# Patient Record
Sex: Male | Born: 1955 | Race: White | Hispanic: No | Marital: Single | Smoking: Former smoker
Health system: Southern US, Community
[De-identification: ages and names within clinical notes are randomized; demographics above are authoritative.]

## PROBLEM LIST (undated history)

## (undated) DIAGNOSIS — K219 Gastro-esophageal reflux disease without esophagitis: Secondary | ICD-10-CM

## (undated) DIAGNOSIS — I10 Essential (primary) hypertension: Secondary | ICD-10-CM

## (undated) DIAGNOSIS — R5383 Other fatigue: Secondary | ICD-10-CM

## (undated) DIAGNOSIS — I5032 Chronic diastolic (congestive) heart failure: Secondary | ICD-10-CM

## (undated) HISTORY — DX: Other fatigue: R53.83

## (undated) HISTORY — DX: Gastro-esophageal reflux disease without esophagitis: K21.9

## (undated) HISTORY — PX: KNEE SURGERY: SHX244

## (undated) HISTORY — DX: Morbid (severe) obesity due to excess calories: E66.01

## (undated) HISTORY — DX: Essential (primary) hypertension: I10

## (undated) HISTORY — DX: Chronic diastolic (congestive) heart failure: I50.32

## (undated) SURGERY — LEFT HEART CATH AND CORONARY ANGIOGRAPHY
Anesthesia: Moderate Sedation

---

## 2003-07-23 ENCOUNTER — Encounter: Admission: RE | Admit: 2003-07-23 | Discharge: 2003-07-23 | Payer: Self-pay | Admitting: Family Medicine

## 2006-04-15 ENCOUNTER — Encounter: Admission: RE | Admit: 2006-04-15 | Discharge: 2006-04-15 | Payer: Self-pay | Admitting: Family Medicine

## 2006-12-23 ENCOUNTER — Encounter: Admission: RE | Admit: 2006-12-23 | Discharge: 2006-12-23 | Payer: Self-pay | Admitting: Family Medicine

## 2007-06-29 IMAGING — CT CT PELVIS W/ CM
2 of 6 series · 16 of 46 positions shown, 18 images · IV contrast (READICAT/WATER & 125 ML OMNI 300)
Comparison: none

CLINICAL DATA: Hematuria.  Frequent urination.   Multiple prostate infections. 
 ABDOMEN CT WITHOUT AND WITH CONTRAST:
TECHNIQUE: Multidetector CT imaging of the abdomen was performed both before and during bolus administration of intravenous contrast.
 Contrast:  125 cc Omnipaque 300
TECHNIQUE: Multidetector CT imaging of the pelvis was performed following the standard protocol during bolus administration of intravenous contrast.

[Series 4: routine abdomen · axial · 0.94mm/px · z∈[-436,-1]mm · 13 of 103 slices shown, 15 images]
[im 8/103  soft-tissue]
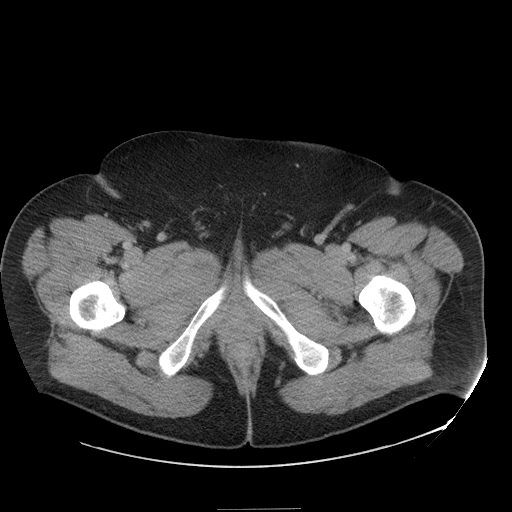
[im 8/103  bone]
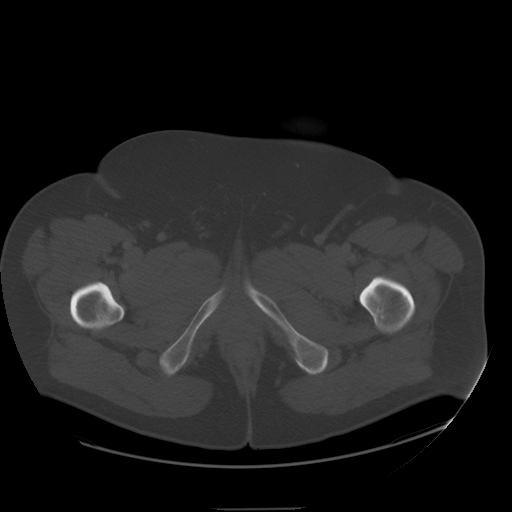
[im 15/103  soft-tissue]
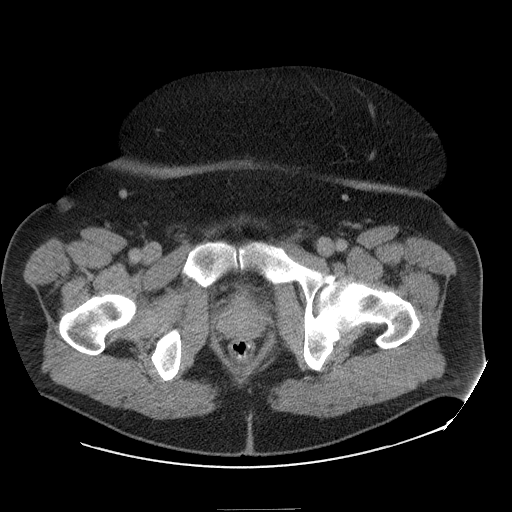
[im 22/103  soft-tissue]
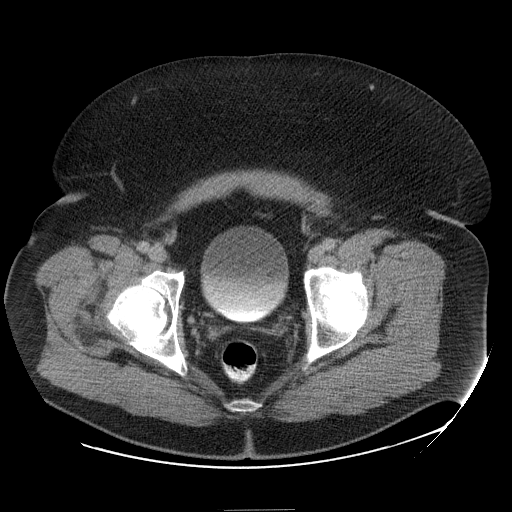
[im 30/103  soft-tissue]
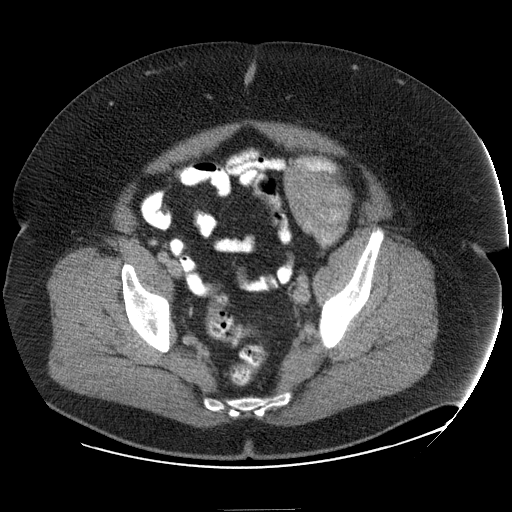
[im 37/103  soft-tissue]
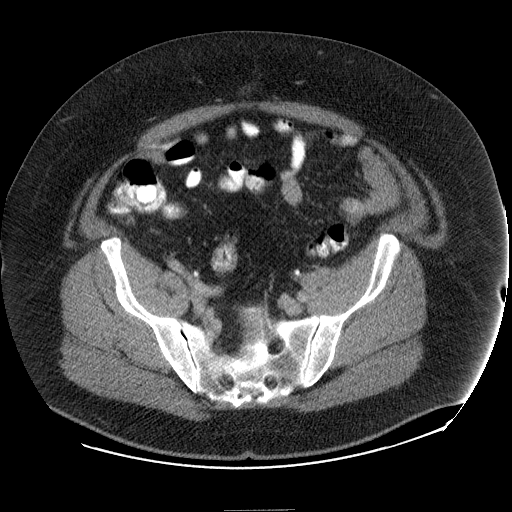
[im 44/103  soft-tissue]
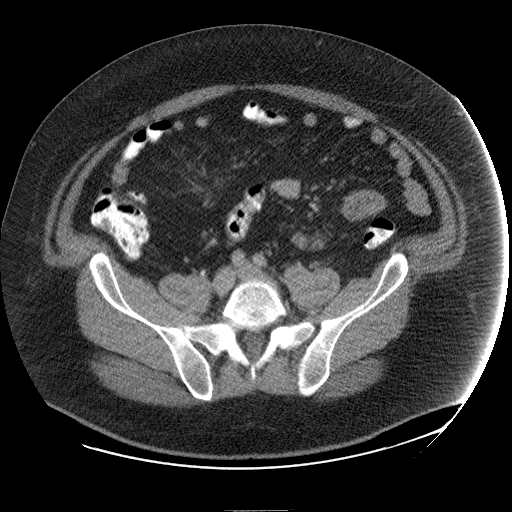
[im 52/103  soft-tissue]
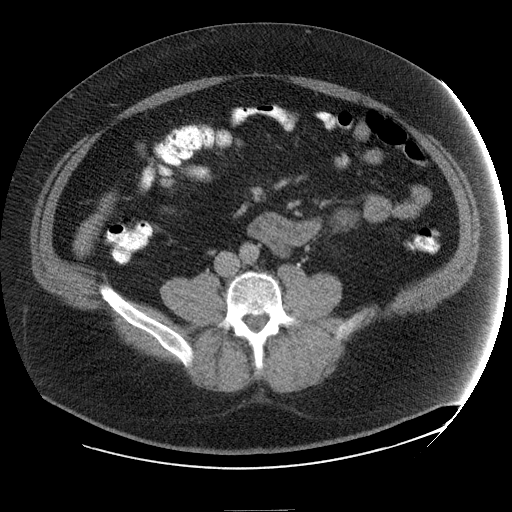
[im 59/103  soft-tissue]
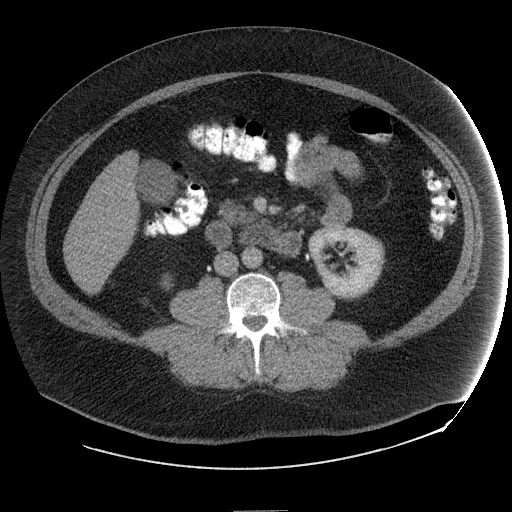
[im 66/103  soft-tissue]
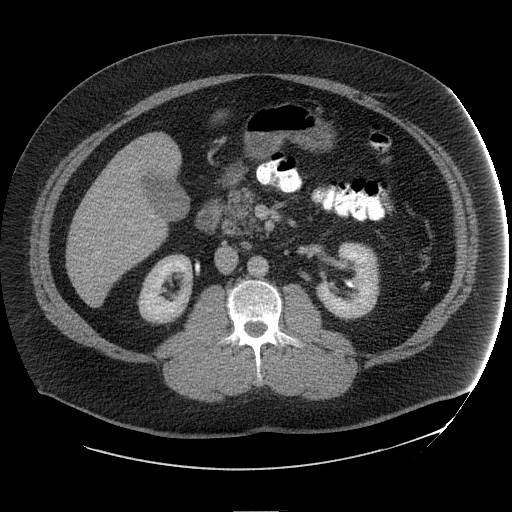
[im 66/103  bone]
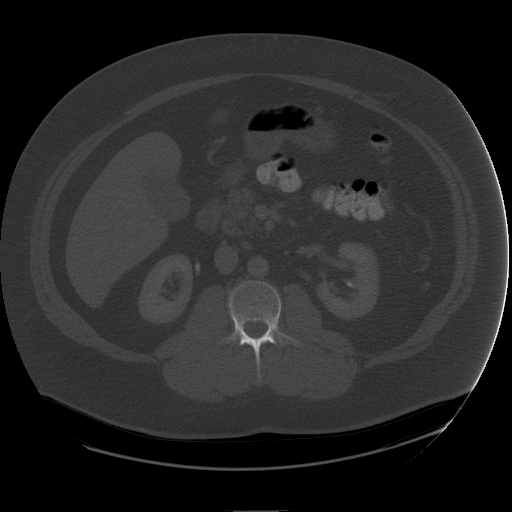
[im 73/103  soft-tissue]
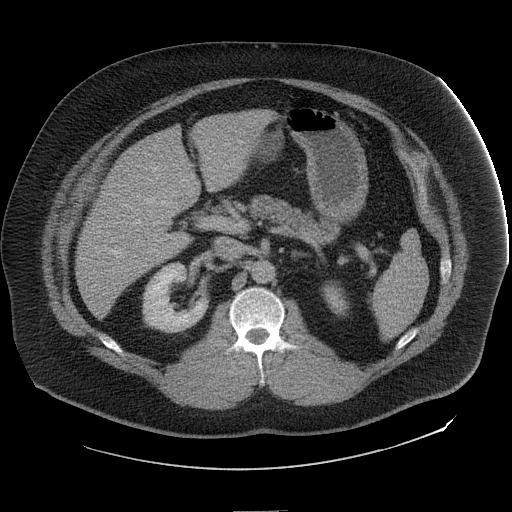
[im 81/103  soft-tissue]
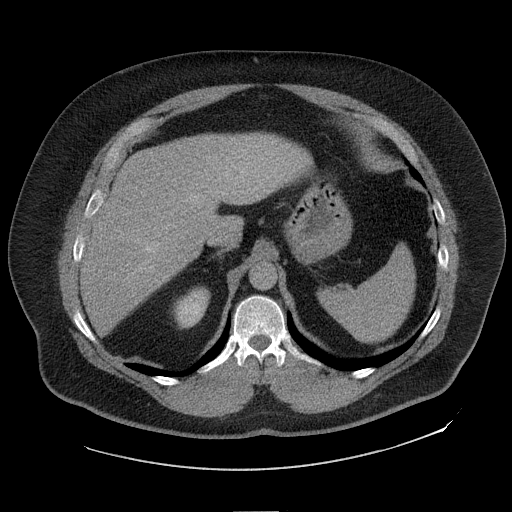
[im 88/103  soft-tissue]
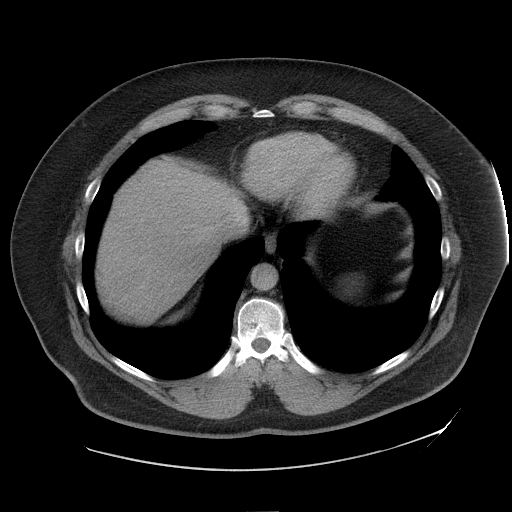
[im 95/103  soft-tissue]
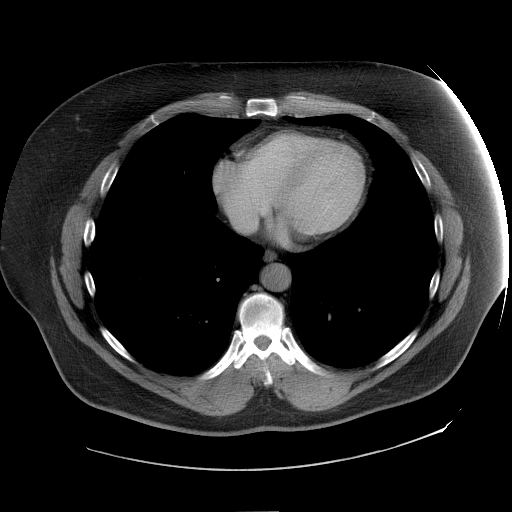

[Series 602: sagittal body · sagittal · 1.02mm/px · 3 of 192 slices shown]
[im 64/192  soft-tissue]
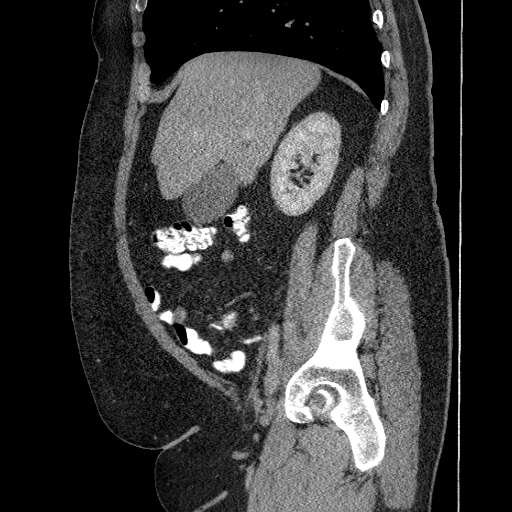
[im 85/192  soft-tissue]
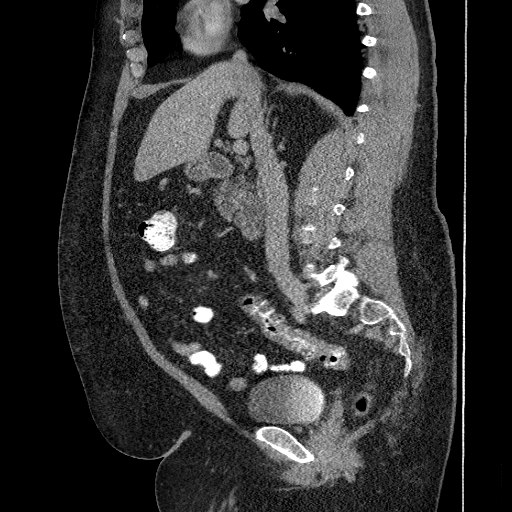
[im 107/192  soft-tissue]
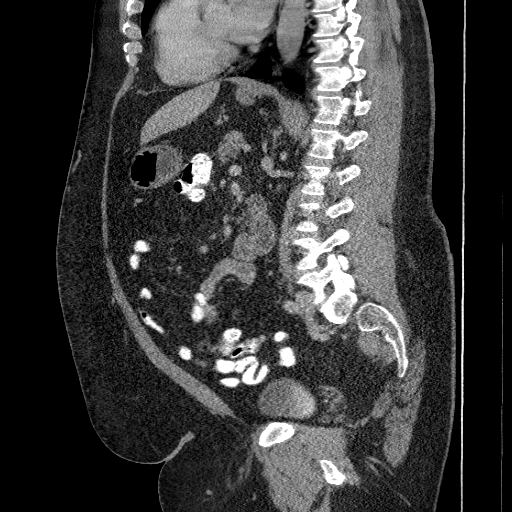

[16 of 46 positions shown; findings below may reference images not displayed]

FINDINGS: There is a 5.5 mm nodule in the right middle lobe on image #1.  CT of the chest may be helpful to assess for other nodules.  On the unenhanced study, no renal calculi are seen.  No calcified gallstones are noted. 
 After contrast administration, the kidneys enhance.  No renal mass is seen.  The pelvocaliceal systems appear normal.  The remainder of the study shows the liver to appear normal.  No ductal dilatation is seen.  The pancreas is normal in size with normal peripancreatic fat planes.  The adrenal glands and spleen appear normal.  The abdominal aorta is normal in caliber and no adenopathy is noted.
IMPRESSION: Negative CT of the abdomen.  No renal mass, calculi or hydronephrosis.  
 PELVIS CT WITH CONTRAST:
FINDINGS: The urinary bladder is moderately well-opacified with no intraluminal lesion evident.  The distal ureters are normal in caliber.  The prostate is normal in size.  The terminal ileum and appendix are well seen and appear normal.  No fluid is seen within the pelvis.  Degenerative disk disease is noted at L5-S1.
IMPRESSION: 1.  No bladder lesion is seen.  The distal ureters are normal in caliber. 
 2.  Appendix and terminal ileum appear normal. 
 3.  Degenerative disk disease at L5-S1.

## 2015-06-10 ENCOUNTER — Ambulatory Visit
Admission: RE | Admit: 2015-06-10 | Discharge: 2015-06-10 | Disposition: A | Discharge status: Home / Self Care | Payer: Self-pay | Source: Ambulatory Visit | Attending: Physician Assistant | Admitting: Physician Assistant

## 2015-06-10 ENCOUNTER — Other Ambulatory Visit: Payer: Self-pay | Admitting: Physician Assistant

## 2015-06-10 DIAGNOSIS — R0602 Shortness of breath: Secondary | ICD-10-CM

## 2015-06-11 ENCOUNTER — Telehealth: Payer: Self-pay | Admitting: Cardiovascular Disease

## 2015-06-11 NOTE — Telephone Encounter (Signed)
Received records from [facility] for appointment on 10/20/2053 with Dr Duke Salviaandolph.  [facility] for Dr Leonides Sakeandolph's schedule on 07/04/15. lp

## 2015-07-04 ENCOUNTER — Encounter: Payer: Self-pay | Admitting: Cardiovascular Disease

## 2015-07-04 ENCOUNTER — Ambulatory Visit (INDEPENDENT_AMBULATORY_CARE_PROVIDER_SITE_OTHER): Payer: BLUE CROSS/BLUE SHIELD | Admitting: Cardiovascular Disease

## 2015-07-04 VITALS — BP 112/74 | HR 76 | Ht 73.0 in | Wt 396.6 lb

## 2015-07-04 DIAGNOSIS — F329 Major depressive disorder, single episode, unspecified: Secondary | ICD-10-CM

## 2015-07-04 DIAGNOSIS — I10 Essential (primary) hypertension: Secondary | ICD-10-CM

## 2015-07-04 DIAGNOSIS — R079 Chest pain, unspecified: Secondary | ICD-10-CM | POA: Diagnosis not present

## 2015-07-04 DIAGNOSIS — R0602 Shortness of breath: Secondary | ICD-10-CM

## 2015-07-04 DIAGNOSIS — R5383 Other fatigue: Secondary | ICD-10-CM

## 2015-07-04 HISTORY — DX: Other fatigue: R53.83

## 2015-07-04 HISTORY — DX: Morbid (severe) obesity due to excess calories: E66.01

## 2015-07-04 HISTORY — DX: Essential (primary) hypertension: I10

## 2015-07-04 LAB — CBC WITH DIFFERENTIAL/PLATELET
BASOS ABS: 0 {cells}/uL (ref 0–200)
Basophils Relative: 0 %
EOS ABS: 174 {cells}/uL (ref 15–500)
Eosinophils Relative: 2 %
HEMATOCRIT: 45.3 % (ref 38.5–50.0)
Hemoglobin: 14.8 g/dL (ref 13.2–17.1)
LYMPHS PCT: 21 %
Lymphs Abs: 1827 cells/uL (ref 850–3900)
MCH: 29.6 pg (ref 27.0–33.0)
MCHC: 32.7 g/dL (ref 32.0–36.0)
MCV: 90.6 fL (ref 80.0–100.0)
MONO ABS: 957 {cells}/uL — AB (ref 200–950)
MPV: 11.4 fL (ref 7.5–12.5)
Monocytes Relative: 11 %
NEUTROS PCT: 66 %
Neutro Abs: 5742 cells/uL (ref 1500–7800)
Platelets: 178 10*3/uL (ref 140–400)
RBC: 5 MIL/uL (ref 4.20–5.80)
RDW: 14.2 % (ref 11.0–15.0)
WBC: 8.7 10*3/uL (ref 3.8–10.8)

## 2015-07-04 LAB — APTT: APTT: 31 s (ref 24–37)

## 2015-07-04 LAB — PROTIME-INR
INR: 1.03 (ref <1.50)
Prothrombin Time: 13.6 seconds (ref 11.6–15.2)

## 2015-07-04 NOTE — Patient Instructions (Addendum)
Medication Instructions:  Your physician recommends that you continue on your current medications as directed. Please refer to the Current Medication list given to you today.  Labwork: PT/INR/PTT/CBC/BMET ON THE FIRST FLOOR AT [facility]   Testing/Procedures: Your physician has requested that you have an echocardiogram. Echocardiography is a painless test that uses sound waves to create images of your heart. It provides your doctor with information about the size and shape of your heart and how well your heart's chambers and valves are working. This procedure takes approximately one hour. There are no restrictions for this procedure. [facility] AT [address]  Your physician has requested that you have a cardiac catheterization. Cardiac catheterization is used to diagnose and/or treat various heart conditions. Doctors may recommend this procedure for a number of different reasons. The most common reason is to evaluate chest pain. Chest pain can be a symptom of coronary artery disease (CAD), and cardiac catheterization can show whether plaque is narrowing or blocking your heart's arteries. This procedure is also used to evaluate the valves, as well as measure the blood flow and oxygen levels in different parts of your heart. For further information please visit [email or web address]. Please follow instruction sheet, as given.  Follow-Up: Your physician recommends that you schedule a follow-up appointment in: 1 MONTH OV  If you need a refill on your cardiac medications before your next appointment, please call your pharmacy.

## 2015-07-04 NOTE — Progress Notes (Signed)
Cardiology Office Note   Date:  07/04/2015   ID:  William Jensen, DOB [birth year], MRN [id number]  PCP:  William Jensen,MICHELLE, MD  Cardiologist:   William Siiffany Agawam, MD   Chief Complaint  Patient presents with  . New Evaluation    Referred by William Redmon, William Jensen for SOB  pt c/o SOB on minimal exertion; occasional chest discomfort--acid reflux; leg cramping--takes potassium      History of Present Illness: William Jensen is a 60 y.o. male with hypertension, GERD, and a hiatal hernia who presents for an evaluation of shortness of breath. William Jensen saw his PCP, William MakiNoelle Reddmon, PA, on 06/10/15.  At that appointment he reported progressive shortness of breath. He was referred to cardiology for evaluation.  William Jensen reports that he has been feeling very short of breath for over a year now.  He feels like he has no energy. By the time he walks into the garage at home he has to stop and sit down. He struggles all day at work and has a hard time climbing into his truck. This is been progressively worse over the last year. Some days it is better than others.  He denies chest pain or pressure. He also has not noted any lower extremity edema or orthopnea. He does have swelling in his knees which is chronic. He notes that he occasionally wakes himself up because he is not breathing well at night. He has been told that it seems like he stops breathing overnight. He also snores heavily. He has never had a sleep study.  He reports frequent nocturia, up to 4 times per night. He typically goes to bed at 10 PM and gets up at 4 AM.   William Jensen does not get any formal exercise. 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 It is becoming increasingly difficult for him to do so.  He has acid reflux that is alleviated with belching.  He denies exertional chest pain.  He gets chest pain after eating hot peppers.  It also occurs when sitting in the recliner.   Past  Medical History  Diagnosis Date  . Hypertension   . Acid reflux   . Morbid obesity (HCC) 07/04/2015  . Essential hypertension 07/04/2015  . Fatigue 07/04/2015    Past Surgical History  Procedure Laterality Date  . Knee surgery      right and left     Current Outpatient Prescriptions  Medication Sig Dispense Refill  . aspirin 81 MG tablet Take 81 mg by mouth daily. Sometimes takes it daily for weeks, then stops    . ibuprofen (ADVIL) 200 MG tablet Take 600 mg by mouth every 6 (six) hours as needed (for pain).    Marland Kitchen. lisinopril (PRINIVIL,ZESTRIL) 20 MG tablet Take 20 mg by mouth daily.    . Misc Natural Products (OSTEO BI-FLEX TRIPLE STRENGTH PO) Take 1-2 tablets by mouth daily.    Marland Kitchen. omeprazole (PRILOSEC) 20 MG capsule Take 20 mg by mouth daily.    . Potassium Gluconate 550 MG TABS Take 4-5 tablets by mouth daily.    . TRIAMCINOLONE ACETONIDE, TOP, 0.05 % OINT Apply 1 application topically 2 (two) times daily.     No current facility-administered medications for this visit.    Allergies:   Nickel and Zinc    Social History:  The patient  reports that he quit smoking about 7 years ago. He has never used smokeless tobacco.  He reports that he does not drink alcohol or use illicit drugs.   Family History:  The patient's family history includes Heart disease in his mother; Prostate cancer in his brother.    ROS:  Please see the history of present illness.   Otherwise, review of systems are positive for none.   All other systems are reviewed and negative.    PHYSICAL EXAM: VS:  BP 112/74 mmHg  Pulse 76  Ht 6\' 1"  (1.854 m)  Wt 179.897 kg (396 lb 9.6 oz)  BMI 52.34 kg/m2 , BMI Body mass index is 52.34 kg/(m^2). GENERAL:  Well appearing.  Morbidly obese HEENT:  Pupils equal round and reactive, fundi not visualized, oral mucosa unremarkable NECK:  Unable to assess neck 2/2 large beard LYMPHATICS:  No cervical adenopathy LUNGS:  Clear to auscultation bilaterally HEART:  RRR.  PMI not  displaced or sustained,S1 and S2 within normal limits, no S3, no S4, no clicks, no rubs, no murmurs ABD:  Flat, positive bowel sounds normal in frequency in pitch, no bruits, no rebound, no guarding, no midline pulsatile mass, no hepatomegaly, no splenomegaly EXT:  2 plus pulses throughout, no edema, no cyanosis no clubbing SKIN:  No rashes no nodules NEURO:  Cranial nerves II through XII grossly intact, motor grossly intact throughout PSYCH:  Cognitively intact, oriented to person place and time   EKG:  EKG is ordered today. The ekg ordered today demonstrates sinus rhythm rate 76 bpm. Left axis deviation. 4//17: Sinus rhythm. Rate 67 bpm. Left anterior fascicular block. (Independent review)  Chest x-ray 06/10/15: No active cardiopulmonary disease  Recent Labs: No results found for requested labs within last 365 days.   06/10/15: Hemoglobin A1c 6.4% WBC 6.8, hemoglobin 14.5, hematocrit 44.9, platelets 161 BNP 20 TSH 0.65 03/22/14: Sodium 138, potassium 4.8, BUN 20, creatinine 1.2 AST 15, ALT 20 Total cholesterol 185, triglycerides 240, HDL 38, LDL 99  Lipid Panel No results found for: CHOL, TRIG, HDL, CHOLHDL, VLDL, LDLCALC, LDLDIRECT    Wt Readings from Last 3 Encounters:  07/04/15 179.897 kg (396 lb 9.6 oz)      ASSESSMENT AND PLAN:  # Chest pain: # Shortness of breath:  William Jensen's symptoms are concerning for heart failure. However, he had a BNP that was within normal limits. This can be falsely low due to obesity. We will obtain an echocardiogram to evaluate for structural heart disease. Ideally, he would undergo a stress test for his atypical chest pain. However, he is too large to undergo nuclear stress testing and is unable to walk on a treadmill. Therefore, we will refer him for cardiac catheterization to rule out ischemia.  # Hypertension: Blood pressure is well-controlled. Continue lisinopril.  # CV Disease Prevention:  We discussed the importance of regular exercise,  diet, and losing weight. He expressed understanding but is not inclined to do much different right now.  He is also limited by knee pain.   # Fatigue: I'm concerned that obstructive sleep apnea may be the cause of William Jensen's fatigue. His Epworth sleepiness scale was 5. However, here endorses daytime fatigue, awakening himself gasping for air, and heavy snoring. I recommended that he undergo a sleep study but he declined. If the above stress tests are normal, he will consider it.  All labs thus far have been within normal limits.   Current medicines are reviewed at length with the patient today.  The patient does not have concerns regarding medicines.  The following changes have been made:  no change  Labs/ tests ordered today include:   Orders Placed This Encounter  Procedures  . PTT  . INR/PT  . CBC with Differential/Platelet  . Basic metabolic panel  . EKG 12-Lead  . ECHOCARDIOGRAM COMPLETE     Disposition:   FU with Tu Shimmel C. Kingston, MD, FACC in 1 month   This note was written with the assistance of speech recognition software.  Please excuse any transcriptional errors.  Signed, Ramia Sidney C. Crewe, MD, FACC  07/04/2015 1:23 PM    [facility]  

## 2015-07-05 ENCOUNTER — Other Ambulatory Visit: Payer: Self-pay | Admitting: Cardiovascular Disease

## 2015-07-05 LAB — BASIC METABOLIC PANEL
BUN: 16 mg/dL (ref 7–25)
CHLORIDE: 102 mmol/L (ref 98–110)
CO2: 25 mmol/L (ref 20–31)
CREATININE: 1.07 mg/dL (ref 0.70–1.25)
Calcium: 9.2 mg/dL (ref 8.6–10.3)
Glucose, Bld: 97 mg/dL (ref 65–99)
Potassium: 4.8 mmol/L (ref 3.5–5.3)
Sodium: 138 mmol/L (ref 135–146)

## 2015-07-08 ENCOUNTER — Telehealth: Payer: Self-pay | Admitting: Cardiovascular Disease

## 2015-07-08 NOTE — Telephone Encounter (Signed)
Fu  Pt returning RN phone call- lab results. Please call back and discuss.   

## 2015-07-08 NOTE — Telephone Encounter (Signed)
Spoke to patient. Lab Result given. Can still proceed with cath . Verbalized understanding

## 2015-07-09 ENCOUNTER — Encounter: Payer: Self-pay | Admitting: Physician Assistant

## 2015-07-10 ENCOUNTER — Telehealth: Payer: Self-pay | Admitting: Cardiovascular Disease

## 2015-07-10 DIAGNOSIS — R0609 Other forms of dyspnea: Secondary | ICD-10-CM

## 2015-07-10 DIAGNOSIS — I209 Angina pectoris, unspecified: Secondary | ICD-10-CM | POA: Diagnosis present

## 2015-07-10 NOTE — Telephone Encounter (Signed)
error 

## 2015-07-11 ENCOUNTER — Encounter (HOSPITAL_COMMUNITY): Payer: Self-pay | Admitting: Cardiology

## 2015-07-11 ENCOUNTER — Encounter (HOSPITAL_COMMUNITY)
Admission: RE | Disposition: A | Discharge status: Home / Self Care | Payer: Self-pay | Source: Ambulatory Visit | Attending: Cardiology

## 2015-07-11 ENCOUNTER — Ambulatory Visit (HOSPITAL_COMMUNITY)
Admission: RE | Admit: 2015-07-11 | Discharge: 2015-07-11 | Disposition: A | Discharge status: Home / Self Care | Payer: BLUE CROSS/BLUE SHIELD | Source: Ambulatory Visit | Attending: Cardiology | Admitting: Cardiology

## 2015-07-11 DIAGNOSIS — Z87891 Personal history of nicotine dependence: Secondary | ICD-10-CM | POA: Insufficient documentation

## 2015-07-11 DIAGNOSIS — I10 Essential (primary) hypertension: Secondary | ICD-10-CM | POA: Diagnosis not present

## 2015-07-11 DIAGNOSIS — I209 Angina pectoris, unspecified: Secondary | ICD-10-CM | POA: Diagnosis not present

## 2015-07-11 DIAGNOSIS — I25119 Atherosclerotic heart disease of native coronary artery with unspecified angina pectoris: Secondary | ICD-10-CM | POA: Insufficient documentation

## 2015-07-11 DIAGNOSIS — K219 Gastro-esophageal reflux disease without esophagitis: Secondary | ICD-10-CM | POA: Insufficient documentation

## 2015-07-11 DIAGNOSIS — R0609 Other forms of dyspnea: Secondary | ICD-10-CM | POA: Diagnosis not present

## 2015-07-11 DIAGNOSIS — Z7982 Long term (current) use of aspirin: Secondary | ICD-10-CM | POA: Insufficient documentation

## 2015-07-11 HISTORY — PX: CARDIAC CATHETERIZATION: SHX172

## 2015-07-11 SURGERY — LEFT HEART CATH AND CORONARY ANGIOGRAPHY
Anesthesia: LOCAL

## 2015-07-11 MED ORDER — HEPARIN (PORCINE) IN NACL 2-0.9 UNIT/ML-% IJ SOLN
INTRAMUSCULAR | Status: DC | PRN
  Administered 2015-07-11: 1500 mL

## 2015-07-11 MED ORDER — FUROSEMIDE 20 MG PO TABS: 20.0000 mg | ORAL_TABLET | Freq: Every day | ORAL | Status: DC

## 2015-07-11 MED ORDER — VERAPAMIL HCL 2.5 MG/ML IV SOLN
INTRAVENOUS | Status: AC
  Filled 2015-07-11: qty 2

## 2015-07-11 MED ORDER — MORPHINE SULFATE (PF) 2 MG/ML IV SOLN: 1.0000 mg | INTRAVENOUS | Status: DC | PRN

## 2015-07-11 MED ORDER — SODIUM CHLORIDE 0.9% FLUSH: 3.0000 mL | INTRAVENOUS | Status: DC | PRN

## 2015-07-11 MED ORDER — LIDOCAINE HCL (PF) 1 % IJ SOLN
INTRAMUSCULAR | Status: DC | PRN
  Administered 2015-07-11: 2 mL via INTRADERMAL

## 2015-07-11 MED ORDER — MIDAZOLAM HCL 2 MG/2ML IJ SOLN
INTRAMUSCULAR | Status: AC
  Filled 2015-07-11: qty 2

## 2015-07-11 MED ORDER — IOPAMIDOL (ISOVUE-370) INJECTION 76%
INTRAVENOUS | Status: AC
  Filled 2015-07-11: qty 100

## 2015-07-11 MED ORDER — FENTANYL CITRATE (PF) 100 MCG/2ML IJ SOLN
INTRAMUSCULAR | Status: AC
  Filled 2015-07-11: qty 2

## 2015-07-11 MED ORDER — LIDOCAINE HCL (PF) 1 % IJ SOLN
INTRAMUSCULAR | Status: AC
  Filled 2015-07-11: qty 30

## 2015-07-11 MED ORDER — SODIUM CHLORIDE 0.9 % WEIGHT BASED INFUSION
3.0000 mL/kg/h | INTRAVENOUS | Status: DC
  Administered 2015-07-11: 3 mL/kg/h via INTRAVENOUS

## 2015-07-11 MED ORDER — FUROSEMIDE 10 MG/ML IJ SOLN
20.0000 mg | Freq: Once | INTRAMUSCULAR | Status: AC
  Administered 2015-07-11: 20 mg via INTRAVENOUS

## 2015-07-11 MED ORDER — ONDANSETRON HCL 4 MG/2ML IJ SOLN: 4.0000 mg | Freq: Four times a day (QID) | INTRAMUSCULAR | Status: DC | PRN

## 2015-07-11 MED ORDER — ASPIRIN 81 MG PO CHEW
CHEWABLE_TABLET | ORAL | Status: AC
  Filled 2015-07-11: qty 1

## 2015-07-11 MED ORDER — FENTANYL CITRATE (PF) 100 MCG/2ML IJ SOLN
INTRAMUSCULAR | Status: DC | PRN
  Administered 2015-07-11: 25 ug via INTRAVENOUS

## 2015-07-11 MED ORDER — VERAPAMIL HCL 2.5 MG/ML IV SOLN
INTRAVENOUS | Status: DC | PRN
  Administered 2015-07-11: 10 mL via INTRA_ARTERIAL

## 2015-07-11 MED ORDER — HEPARIN (PORCINE) IN NACL 2-0.9 UNIT/ML-% IJ SOLN
INTRAMUSCULAR | Status: AC
  Filled 2015-07-11: qty 1000

## 2015-07-11 MED ORDER — SODIUM CHLORIDE 0.9 % IV SOLN: INTRAVENOUS | Status: DC

## 2015-07-11 MED ORDER — SODIUM CHLORIDE 0.9 % IV SOLN: 250.0000 mL | INTRAVENOUS | Status: DC | PRN

## 2015-07-11 MED ORDER — SODIUM CHLORIDE 0.9% FLUSH: 3.0000 mL | Freq: Two times a day (BID) | INTRAVENOUS | Status: DC

## 2015-07-11 MED ORDER — ACETAMINOPHEN 325 MG PO TABS: 650.0000 mg | ORAL_TABLET | ORAL | Status: DC | PRN

## 2015-07-11 MED ORDER — IOPAMIDOL (ISOVUE-370) INJECTION 76%
INTRAVENOUS | Status: DC | PRN
Start: 2015-07-11 — End: 2015-07-11
  Administered 2015-07-11: 110 mL via INTRA_ARTERIAL

## 2015-07-11 MED ORDER — FUROSEMIDE 10 MG/ML IJ SOLN
INTRAMUSCULAR | Status: AC
  Administered 2015-07-11: 20 mg via INTRAVENOUS
  Filled 2015-07-11: qty 4

## 2015-07-11 MED ORDER — HEPARIN SODIUM (PORCINE) 1000 UNIT/ML IJ SOLN
INTRAMUSCULAR | Status: AC
  Filled 2015-07-11: qty 1

## 2015-07-11 MED ORDER — HEPARIN (PORCINE) IN NACL 2-0.9 UNIT/ML-% IJ SOLN
INTRAMUSCULAR | Status: AC
  Filled 2015-07-11: qty 500

## 2015-07-11 MED ORDER — ASPIRIN 81 MG PO CHEW
81.0000 mg | CHEWABLE_TABLET | ORAL | Status: AC
  Administered 2015-07-11: 81 mg via ORAL

## 2015-07-11 MED ORDER — MIDAZOLAM HCL 2 MG/2ML IJ SOLN
INTRAMUSCULAR | Status: DC | PRN
  Administered 2015-07-11: 1 mg via INTRAVENOUS

## 2015-07-11 MED ORDER — SODIUM CHLORIDE 0.9 % WEIGHT BASED INFUSION: 1.0000 mL/kg/h | INTRAVENOUS | Status: DC

## 2015-07-11 MED ORDER — FUROSEMIDE 20 MG PO TABS: 20.0000 mg | ORAL_TABLET | Freq: Every day | ORAL | Status: AC

## 2015-07-11 MED ORDER — HEPARIN SODIUM (PORCINE) 1000 UNIT/ML IJ SOLN
INTRAMUSCULAR | Status: DC | PRN
  Administered 2015-07-11: 6000 [IU] via INTRAVENOUS

## 2015-07-11 SURGICAL SUPPLY — 12 items
CATH INFINITI 5FR ANG PIGTAIL (CATHETERS) ×1 IMPLANT
CATH INFINITI JR4 5F (CATHETERS) ×1 IMPLANT
CATH OPTITORQUE TIG 4.0 5F (CATHETERS) ×1 IMPLANT
DEVICE RAD COMP TR BAND LRG (VASCULAR PRODUCTS) ×1 IMPLANT
GLIDESHEATH SLEND A-KIT 6F 22G (SHEATH) ×1 IMPLANT
HOVERMATT SINGLE USE (MISCELLANEOUS) ×1 IMPLANT
KIT HEART LEFT (KITS) ×2 IMPLANT
PACK CARDIAC CATHETERIZATION (CUSTOM PROCEDURE TRAY) ×2 IMPLANT
SYR MEDRAD MARK V 150ML (SYRINGE) ×2 IMPLANT
TRANSDUCER W/STOPCOCK (MISCELLANEOUS) ×2 IMPLANT
TUBING CIL FLEX 10 FLL-RA (TUBING) ×2 IMPLANT
WIRE SAFE-T 1.5MM-J .035X260CM (WIRE) ×1 IMPLANT

## 2015-07-11 NOTE — Interval H&P Note (Signed)
History and Physical Interval Note:  07/11/2015 9:16 AM  William Jensen  has presented today for surgery, with the diagnosis of exertional dyspnea as anginal equivalent. Considered progressive angina    The various methods of treatment have been discussed with the patient and family. After consideration of risks, benefits and other options for treatment, the patient has consented to  Procedure(s): Left Heart Cath and Coronary Angiography (N/A) With Possible Percutaneous Coronary Intervention as a surgical intervention .  The patient's history has been reviewed, patient examined, no change in status, stable for surgery.  I have reviewed the patient's chart and labs.  Questions were answered to the patient's satisfaction.    Cath Lab Visit (complete for each Cath Lab visit)  Clinical Evaluation Leading to the Procedure:   ACS: No.  Non-ACS:    Anginal Classification: CCS III  Anti-ischemic medical therapy: Minimal Therapy (1 class of medications)  Non-Invasive Test Results: No non-invasive testing performed  Prior CABG: No previous CABG   HARDING, DAVID W  AUC for Cath CAD Assessment (Coronary Angiography With or Without Left Heart Catheterization and/or Left Ventriculography)  Patient Information:    Suspected CAD (No Prior PCI, No Prior CABG, and No Prior Angiogram Showing >= 50% Angiographic Stenosis)   No Prior Noninvasive Testing   Symptomatic   Intermediate Pretest Probability  AUC Score:   U (6)   Indication:   9  The decision was made to proceed with catheterization due to the patient's body habitus. Nuclear stress test likely not be accurate.  AUC for PCI will be pending results of diagnostic catheterization.  Marykay LexHARDING, DAVID W, M.D., M.S. Interventional Cardiologist   Pager # [phone] Phone # [phone] [address] [place], [place] [address]

## 2015-07-11 NOTE — H&P (View-Only) (Signed)
Cardiology Office Note   Date:  07/04/2015   ID:  William Jensen, DOB [birth year], MRN [id number]  PCP:  William HillierEDMAN,MICHELLE, MD  Cardiologist:   William Siiffany Agawam, MD   Chief Complaint  Patient presents with  . New Evaluation    Referred by William Redmon, PA-C for SOB  pt c/o SOB on minimal exertion; occasional chest discomfort--acid reflux; leg cramping--takes potassium      History of Present Illness: William Jensen is a 60 y.o. male with hypertension, GERD, and a hiatal hernia who presents for an evaluation of shortness of breath. William Jensen saw his PCP, William MakiNoelle Reddmon, PA, on 06/10/15.  At that appointment he reported progressive shortness of breath. He was referred to cardiology for evaluation.  William Jensen reports that he has been feeling very short of breath for over a year now.  He feels like he has no energy. By the time he walks into the garage at home he has to stop and sit down. He struggles all day at work and has a hard time climbing into his truck. This is been progressively worse over the last year. Some days it is better than others.  He denies chest pain or pressure. He also has not noted any lower extremity edema or orthopnea. He does have swelling in his knees which is chronic. He notes that he occasionally wakes himself up because he is not breathing well at night. He has been told that it seems like he stops breathing overnight. He also snores heavily. He has never had a sleep study.  He reports frequent nocturia, up to 4 times per night. He typically goes to bed at 10 PM and gets up at 4 AM.   William Jensen does not get any formal exercise. 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 It is becoming increasingly difficult for him to do so.  He has acid reflux that is alleviated with belching.  He denies exertional chest pain.  He gets chest pain after eating hot peppers.  It also occurs when sitting in the recliner.   Past  Medical History  Diagnosis Date  . Hypertension   . Acid reflux   . Morbid obesity (HCC) 07/04/2015  . Essential hypertension 07/04/2015  . Fatigue 07/04/2015    Past Surgical History  Procedure Laterality Date  . Knee surgery      right and left     Current Outpatient Prescriptions  Medication Sig Dispense Refill  . aspirin 81 MG tablet Take 81 mg by mouth daily. Sometimes takes it daily for weeks, then stops    . ibuprofen (ADVIL) 200 MG tablet Take 600 mg by mouth every 6 (six) hours as needed (for pain).    Marland Kitchen. lisinopril (PRINIVIL,ZESTRIL) 20 MG tablet Take 20 mg by mouth daily.    . Misc Natural Products (OSTEO BI-FLEX TRIPLE STRENGTH PO) Take 1-2 tablets by mouth daily.    Marland Kitchen. omeprazole (PRILOSEC) 20 MG capsule Take 20 mg by mouth daily.    . Potassium Gluconate 550 MG TABS Take 4-5 tablets by mouth daily.    . TRIAMCINOLONE ACETONIDE, TOP, 0.05 % OINT Apply 1 application topically 2 (two) times daily.     No current facility-administered medications for this visit.    Allergies:   Nickel and Zinc    Social History:  The patient  reports that he quit smoking about 7 years ago. He has never used smokeless tobacco.  He reports that he does not drink alcohol or use illicit drugs.   Family History:  The patient's family history includes Heart disease in his mother; Prostate cancer in his brother.    ROS:  Please see the history of present illness.   Otherwise, review of systems are positive for none.   All other systems are reviewed and negative.    PHYSICAL EXAM: VS:  BP 112/74 mmHg  Pulse 76  Ht 6\' 1"  (1.854 m)  Wt 179.897 kg (396 lb 9.6 oz)  BMI 52.34 kg/m2 , BMI Body mass index is 52.34 kg/(m^2). GENERAL:  Well appearing.  Morbidly obese HEENT:  Pupils equal round and reactive, fundi not visualized, oral mucosa unremarkable NECK:  Unable to assess neck 2/2 large beard LYMPHATICS:  No cervical adenopathy LUNGS:  Clear to auscultation bilaterally HEART:  RRR.  PMI not  displaced or sustained,S1 and S2 within normal limits, no S3, no S4, no clicks, no rubs, no murmurs ABD:  Flat, positive bowel sounds normal in frequency in pitch, no bruits, no rebound, no guarding, no midline pulsatile mass, no hepatomegaly, no splenomegaly EXT:  2 plus pulses throughout, no edema, no cyanosis no clubbing SKIN:  No rashes no nodules NEURO:  Cranial nerves II through XII grossly intact, motor grossly intact throughout PSYCH:  Cognitively intact, oriented to person place and time   EKG:  EKG is ordered today. The ekg ordered today demonstrates sinus rhythm rate 76 bpm. Left axis deviation. 4//17: Sinus rhythm. Rate 67 bpm. Left anterior fascicular block. (Independent review)  Chest x-ray 06/10/15: No active cardiopulmonary disease  Recent Labs: No results found for requested labs within last 365 days.   06/10/15: Hemoglobin A1c 6.4% WBC 6.8, hemoglobin 14.5, hematocrit 44.9, platelets 161 BNP 20 TSH 0.65 03/22/14: Sodium 138, potassium 4.8, BUN 20, creatinine 1.2 AST 15, ALT 20 Total cholesterol 185, triglycerides 240, HDL 38, LDL 99  Lipid Panel No results found for: CHOL, TRIG, HDL, CHOLHDL, VLDL, LDLCALC, LDLDIRECT    Wt Readings from Last 3 Encounters:  07/04/15 179.897 kg (396 lb 9.6 oz)      ASSESSMENT AND PLAN:  # Chest pain: # Shortness of breath:  [name]'s symptoms are concerning for heart failure. However, he had a BNP that was within normal limits. This can be falsely low due to obesity. We will obtain an echocardiogram to evaluate for structural heart disease. Ideally, he would undergo a stress test for his atypical chest pain. However, he is too large to undergo nuclear stress testing and is unable to walk on a treadmill. Therefore, we will refer him for cardiac catheterization to rule out ischemia.  # Hypertension: Blood pressure is well-controlled. Continue lisinopril.  # CV Disease Prevention:  We discussed the importance of regular exercise,  diet, and losing weight. He expressed understanding but is not inclined to do much different right now.  He is also limited by knee pain.   # Fatigue: I'm concerned that obstructive sleep apnea may be the cause of [name]'s fatigue. His Epworth sleepiness scale was 5. However, here endorses daytime fatigue, awakening himself gasping for air, and heavy snoring. I recommended that he undergo a sleep study but he declined. If the above stress tests are normal, he will consider it.  All labs thus far have been within normal limits.   Current medicines are reviewed at length with the patient today.  The patient does not have concerns regarding medicines.  The following changes have been made:  no change  Labs/ tests ordered today include:   Orders Placed This Encounter  Procedures  . PTT  . INR/PT  . CBC with Differential/Platelet  . Basic metabolic panel  . EKG 12-Lead  . ECHOCARDIOGRAM COMPLETE     Disposition:   FU with William Mink C. Duke Salvia, MD, [facility] in 1 month   This note was written with the assistance of speech recognition software.  Please excuse any transcriptional errors.  Signed, Phiona Ramnauth C. Duke Salvia, MD, [facility]  07/04/2015 1:23 PM    [facility]

## 2015-07-11 NOTE — Discharge Instructions (Signed)
Radial Site Care °Refer to this sheet in the next few weeks. These instructions provide you with information about caring for yourself after your procedure. Your health care provider may also give you more specific instructions. Your treatment has been planned according to current medical practices, but problems sometimes occur. Call your health care provider if you have any problems or questions after your procedure. °WHAT TO EXPECT AFTER THE PROCEDURE °After your procedure, it is typical to have the following: °· Bruising at the radial site that usually fades within 1-2 weeks. °· Blood collecting in the tissue (hematoma) that may be painful to the touch. It should usually decrease in size and tenderness within 1-2 weeks. °HOME CARE INSTRUCTIONS °· Take medicines only as directed by your health care provider. °· You may shower 24-48 hours after the procedure or as directed by your health care provider. Remove the bandage (dressing) and gently wash the site with plain soap and water. Pat the area dry with a clean towel. Do not rub the site, because this may cause bleeding. °· Do not take baths, swim, or use a hot tub until your health care provider approves. °· Check your insertion site every day for redness, swelling, or drainage. °· Do not apply powder or lotion to the site. °· Do not flex or bend the affected arm for 24 hours or as directed by your health care provider. °· Do not push or pull heavy objects with the affected arm for 24 hours or as directed by your health care provider. °· Do not lift over 10 lb (4.5 kg) for 5 days after your procedure or as directed by your health care provider. °· Ask your health care provider when it is okay to: °¨ Return to work or school. °¨ Resume usual physical activities or sports. °¨ Resume sexual activity. °· Do not drive home if you are discharged the same day as the procedure. Have someone else drive you. °· You may drive 24 hours after the procedure unless otherwise  instructed by your health care provider. °· Do not operate machinery or power tools for 24 hours after the procedure. °· If your procedure was done as an outpatient procedure, which means that you went home the same day as your procedure, a responsible adult should be with you for the first 24 hours after you arrive home. °· Keep all follow-up visits as directed by your health care provider. This is important. °SEEK MEDICAL CARE IF: °· You have a fever. °· You have chills. °· You have increased bleeding from the radial site. Hold pressure on the site and call 911. °SEEK IMMEDIATE MEDICAL CARE IF: °· You have unusual pain at the radial site. °· You have redness, warmth, or swelling at the radial site. °· You have drainage (other than a small amount of blood on the dressing) from the radial site. °· The radial site is bleeding, and the bleeding does not stop after 30 minutes of holding steady pressure on the site. °· Your arm or hand becomes pale, cool, tingly, or numb. °  °This information is not intended to replace advice given to you by your health care provider. Make sure you discuss any questions you have with your health care provider. °  °Document Released: 03/27/2010 Document Revised: 03/15/2014 Document Reviewed: 09/10/2013 °Elsevier Interactive Patient Education ©2016 [employer] ° °

## 2015-07-16 ENCOUNTER — Other Ambulatory Visit (HOSPITAL_COMMUNITY): Payer: BLUE CROSS/BLUE SHIELD

## 2015-07-22 ENCOUNTER — Other Ambulatory Visit: Payer: Self-pay

## 2015-07-22 ENCOUNTER — Other Ambulatory Visit (HOSPITAL_COMMUNITY): Payer: Self-pay | Admitting: *Deleted

## 2015-07-22 ENCOUNTER — Ambulatory Visit (HOSPITAL_COMMUNITY): Payer: BLUE CROSS/BLUE SHIELD | Attending: Cardiology

## 2015-07-22 DIAGNOSIS — R0602 Shortness of breath: Secondary | ICD-10-CM

## 2015-07-22 DIAGNOSIS — I77810 Thoracic aortic ectasia: Secondary | ICD-10-CM | POA: Insufficient documentation

## 2015-07-22 DIAGNOSIS — R079 Chest pain, unspecified: Secondary | ICD-10-CM

## 2015-07-22 DIAGNOSIS — Z6841 Body Mass Index (BMI) 40.0 and over, adult: Secondary | ICD-10-CM | POA: Insufficient documentation

## 2015-07-22 DIAGNOSIS — I119 Hypertensive heart disease without heart failure: Secondary | ICD-10-CM | POA: Insufficient documentation

## 2015-07-22 MED ORDER — PERFLUTREN LIPID MICROSPHERE
1.0000 mL | INTRAVENOUS | Status: AC | PRN
  Administered 2015-07-22: 3 mL via INTRAVENOUS

## 2015-07-25 ENCOUNTER — Telehealth: Payer: Self-pay | Admitting: Cardiovascular Disease

## 2015-07-25 DIAGNOSIS — I10 Essential (primary) hypertension: Secondary | ICD-10-CM

## 2015-07-25 DIAGNOSIS — I209 Angina pectoris, unspecified: Secondary | ICD-10-CM

## 2015-07-25 DIAGNOSIS — R0609 Other forms of dyspnea: Principal | ICD-10-CM

## 2015-07-25 NOTE — Telephone Encounter (Signed)
-----   Message from Chilton Siiffany Upham, MD sent at 07/23/2015  8:43 PM EDT ----- Echo shows that his heart does not relax completely.  Aorta is mildly dilated.  We will need to repeat the echo in 1 year.  Continue lasix as started by Dr. Herbie BaltimoreHarding.

## 2015-07-25 NOTE — Telephone Encounter (Signed)
Follow Up ° °Pt returned call//  °

## 2015-07-25 NOTE — Telephone Encounter (Signed)
Left message to call back  

## 2015-07-29 NOTE — Telephone Encounter (Signed)
Returned call to pt. Gave him the reults, He verbalized understanding and is aware of f/u appt. Order for echo in 1 year entered.

## 2015-07-29 NOTE — Telephone Encounter (Signed)
F/u  Pt returning Call- pt is off work the rest of today and is able to receive phone calls. Please call back and discuss.

## 2015-08-18 NOTE — Progress Notes (Signed)
Cardiology Office Note   Date:  08/19/2015   ID:  William Jensen, DOB [birth year], MRN [id number]  PCP:  REDMON,NOELLE, PA-C  Cardiologist:   Chilton Siiffany [place], MD   Chief Complaint  Patient presents with  . Follow-up    post ECHO  pt c/o SOB on minimal exertion      History of Present Illness: William Jensen is a 60 y.o. male with hypertension, GERD, and a hiatal hernia who presents for follow up.  William Jensen was first evaluated for shortness of breath on 07/04/15.  His symptoms were concerning for ischemia. However he was not eligible for stress testing due to body habitus. Therefore, he underwent cardiac catheterization on 07/11/15. He was found to have a 10% LAD lesion. His LVEDP was 34 mmHg.  Echocardiography on 07/22/15 revealed LVEF 55-60% and grade 2 diastolic dysfunction. There is mild dilation of the ascending aorta.   Since his last appointment it was recommended that William Jensen start taking lasix.  He did Lasix for a couple days but discontinued it because it was making him go to the bathroom too much. It was also causing cramping in his legs. He currently takes 5-6 over the counter potassium tablets daily.  He didn't notice any change in his breathing while taking the Lasix. He continues to deny lower extremity edema or orthopnea. Shortness of breath is worse when he is out in hot weather.  At the last appointment we also discussed getting a sleep study. He was unwilling to do so at the time. He has talked with his boss who gave him a CPAP machine, as he is not using it.  He is not interested in getting a sleep study because it will cost him more money.  He is struggling to manage his current medical bills.      Past Medical History  Diagnosis Date  . Hypertension   . Acid reflux   . Morbid obesity (HCC) 07/04/2015  . Essential hypertension 07/04/2015  . Fatigue 07/04/2015  . Chronic diastolic heart failure (HCC) 08/19/2015    Past Surgical History  Procedure Laterality  Date  . Knee surgery      right and left  . Cardiac catheterization N/A 07/11/2015    Procedure: Left Heart Cath and Coronary Angiography;  Surgeon: Marykay Lexavid W Harding, MD;  [facility];  Service: Cardiovascular;  Laterality: N/A;     Current Outpatient Prescriptions  Medication Sig Dispense Refill  . aspirin 81 MG tablet Take 81 mg by mouth daily as needed (for anticoagulation). Sometimes takes it daily for weeks, then stops    . furosemide (LASIX) 20 MG tablet Take 1 tablet (20 mg total) by mouth daily. 30 tablet 2  . ibuprofen (ADVIL) 200 MG tablet Take 600 mg by mouth daily as needed for moderate pain.     Marland Kitchen. lisinopril (PRINIVIL,ZESTRIL) 20 MG tablet Take 20 mg by mouth daily. Pt states he does not take every day    . Misc Natural Products (OSTEO BI-FLEX TRIPLE STRENGTH PO) Take 1 tablet by mouth daily.     Marland Kitchen. omeprazole (PRILOSEC) 20 MG capsule Take 20 mg by mouth daily.    . Potassium Gluconate 550 MG TABS Take 4-6 tablets by mouth daily.     Marland Kitchen. tetrahydrozoline-zinc (VISINE-AC) 0.05-0.25 % ophthalmic solution Place 1 drop into both eyes daily as needed (for dry eyes).    . TRIAMCINOLONE ACETONIDE, TOP, 0.05 % OINT Apply 1 application topically 2 (two) times  daily as needed (for thick skin on the hand).      No current facility-administered medications for this visit.    Allergies:   Nickel and Zinc    Social History:  The patient  reports that he quit smoking about 7 years ago. He has never used smokeless tobacco. He reports that he does not drink alcohol or use illicit drugs.   Family History:  The patient's family history includes Heart disease in his mother; Prostate cancer in his brother.    ROS:  Please see the history of present illness.   Otherwise, review of systems are positive for none.   All other systems are reviewed and negative.    PHYSICAL EXAM: VS:  BP 136/80 mmHg  Pulse 88  Ht  (1.854 m)  Wt 394 lb 6.4 oz (178.899 kg)  BMI 52.05 kg/m2 ,  BMI Body mass index is 52.05 kg/(m^2). GENERAL:  Well appearing.  Morbidly obese HEENT:  Pupils equal round and reactive, fundi not visualized, oral mucosa unremarkable NECK:  Unable to assess neck 2/2 large beard LYMPHATICS:  No cervical adenopathy LUNGS:  Clear to auscultation bilaterally HEART:  RRR.  PMI not displaced or sustained,S1 and S2 within normal limits, no S3, no S4, no clicks, no rubs, no murmurs ABD:  Flat, positive bowel sounds normal in frequency in pitch, no bruits, no rebound, no guarding, no midline pulsatile mass, no hepatomegaly, no splenomegaly EXT:  2 plus pulses throughout, no edema, no cyanosis no clubbing SKIN:  No rashes no nodules NEURO:  Cranial nerves II through XII grossly intact, motor grossly intact throughout PSYCH:  Cognitively intact, oriented to person place and time   EKG:  EKG is not ordered today. The ekg ordered 07/04/15 demonstrates sinus rhythm rate 76 bpm. Left axis deviation. 4//17: Sinus rhythm. Rate 67 bpm. Left anterior fascicular block. (Independent review)  Echo 07/22/15: Study Conclusions  - Left ventricle: The cavity size was normal. Wall thickness was  increased in a pattern of mild LVH. Systolic function was normal.  The estimated ejection fraction was in the range of 55% to 60%.  Wall motion was normal; there were no regional wall motion  abnormalities. Features are consistent with a pseudonormal left  ventricular filling pattern, with concomitant abnormal relaxation  and increased filling pressure (grade 2 diastolic dysfunction). - Aortic root: The aortic root was mildly dilated. - Ascending aorta: The ascending aorta was mildly dilated.  Impressions:  - Technically difficult; definity used; normal LV systolic  function; grade 2 diastolic dysfunction.  LHC 07/11/15: 1. Prox LAD to Mid LAD lesion, 10% stenosed. 2. The left ventricular systolic function is normal. 3. Severely elevated LVEDP of 34 lm mercury   Chest  x-ray 06/10/15: No active cardiopulmonary disease  Recent Labs: 07/04/2015: BUN 16; Creat 1.07; Hemoglobin 14.8; Platelets 178; Potassium 4.8; Sodium 138   06/10/15: Hemoglobin A1c 6.4% WBC 6.8, hemoglobin 14.5, hematocrit 44.9, platelets 161 BNP 20 TSH 0.65  03/22/14: Sodium 138, potassium 4.8, BUN 20, creatinine 1.2 AST 15, ALT 20 Total cholesterol 185, triglycerides 240, HDL 38, LDL 99  Lipid Panel No results found for: CHOL, TRIG, HDL, CHOLHDL, VLDL, LDLCALC, LDLDIRECT    Wt Readings from Last 3 Encounters:  08/19/15 394 lb 6.4 oz (178.899 kg)  07/04/15 396 lb 9.6 oz (179.897 kg)      ASSESSMENT AND PLAN:  # Chest pain: Chest pain has resolved and he had nonobstructive coronary disease on heart cath.  # Shortness of breath:   #  Chronic diastolic heart failure: Symptoms are likely due to diastolic dysfunction.  LVEDP was 34 mmHg on cath.  He has not been taking lasix.  We discussed the importance of limiting his fluid intake to 1.5-2 L daily. I also encouraged him to reduce his sodium to 2 g daily. Reports that he does not have salt any of his food but he does eat out at restaurants for most meals. We discussed the fact that these meals likely include high doses of salt.  We also discussed the importance of he lasix.  He is already on lisinopril and we cannot titrate his afterload reduction.  We will check a BMP now and in 2 weeks to adjust his potassium supplementation.   # Hypertension: Blood pressure is well-controlled. Continue lisinopril.  # OSA: He is not interested in getting a sleep study.  He will continue to use his boss's machine that has not been prescribed.   # CV Disease Prevention:  We discussed the importance of regular exercise, diet, and losing weight. He expressed understanding but is not inclined to do much different right now.  He is also limited by knee pain.    Current medicines are reviewed at length with the patient today.  The patient does not have  concerns regarding medicines.  The following changes have been made:  no change  Labs/ tests ordered today include:   Orders Placed This Encounter  Procedures  . Basic Metabolic Panel (BMET)  . Basic Metabolic Panel (BMET)    Time spent: 30 minutes-Greater than 50% of this time was spent in counseling, explanation of diagnosis, planning of further management, and coordination of care.  Disposition:   FU with Keonta Alsip C. Duke Salvia, MD, [facility] in 2 months   This note was written with the assistance of speech recognition software.  Please excuse any transcriptional errors.  Signed, Crestina Strike C. Duke Salvia, MD, [facility]  08/19/2015 9:54 AM    [facility]

## 2015-08-19 ENCOUNTER — Ambulatory Visit (INDEPENDENT_AMBULATORY_CARE_PROVIDER_SITE_OTHER): Payer: BLUE CROSS/BLUE SHIELD | Admitting: Cardiovascular Disease

## 2015-08-19 ENCOUNTER — Encounter: Payer: Self-pay | Admitting: Cardiovascular Disease

## 2015-08-19 VITALS — BP 136/80 | HR 88 | Ht 73.0 in | Wt 394.4 lb

## 2015-08-19 DIAGNOSIS — I5032 Chronic diastolic (congestive) heart failure: Secondary | ICD-10-CM | POA: Diagnosis not present

## 2015-08-19 DIAGNOSIS — E876 Hypokalemia: Secondary | ICD-10-CM | POA: Diagnosis not present

## 2015-08-19 DIAGNOSIS — G4733 Obstructive sleep apnea (adult) (pediatric): Secondary | ICD-10-CM

## 2015-08-19 DIAGNOSIS — I10 Essential (primary) hypertension: Secondary | ICD-10-CM

## 2015-08-19 HISTORY — DX: Chronic diastolic (congestive) heart failure: I50.32

## 2015-08-19 NOTE — Patient Instructions (Signed)
Medication Instructions:   START FUROSEMIDE AS PRESCRIBED  Labwork:  Your physician recommends that you HAVE LAB WORK TODAY  Your physician recommends that you return for lab work in: 2 WEEKS AFTER TAKING FUROSEMIDE  Follow-Up:  Your physician recommends that you schedule a follow-up appointment in: BY THE END OF AUGUST [facility]

## 2015-10-20 ENCOUNTER — Ambulatory Visit: Payer: BLUE CROSS/BLUE SHIELD | Admitting: Cardiovascular Disease

## 2016-07-23 ENCOUNTER — Other Ambulatory Visit (HOSPITAL_COMMUNITY): Payer: BLUE CROSS/BLUE SHIELD

## 2016-08-23 IMAGING — CR DG CHEST 2V
2 series · 2 of 2 positions shown · non-contrast
Comparison: December 23, 2006.

CLINICAL DATA: Shortness of breath for several months.

EXAM:
CHEST  2 VIEW

[w chest pa *]
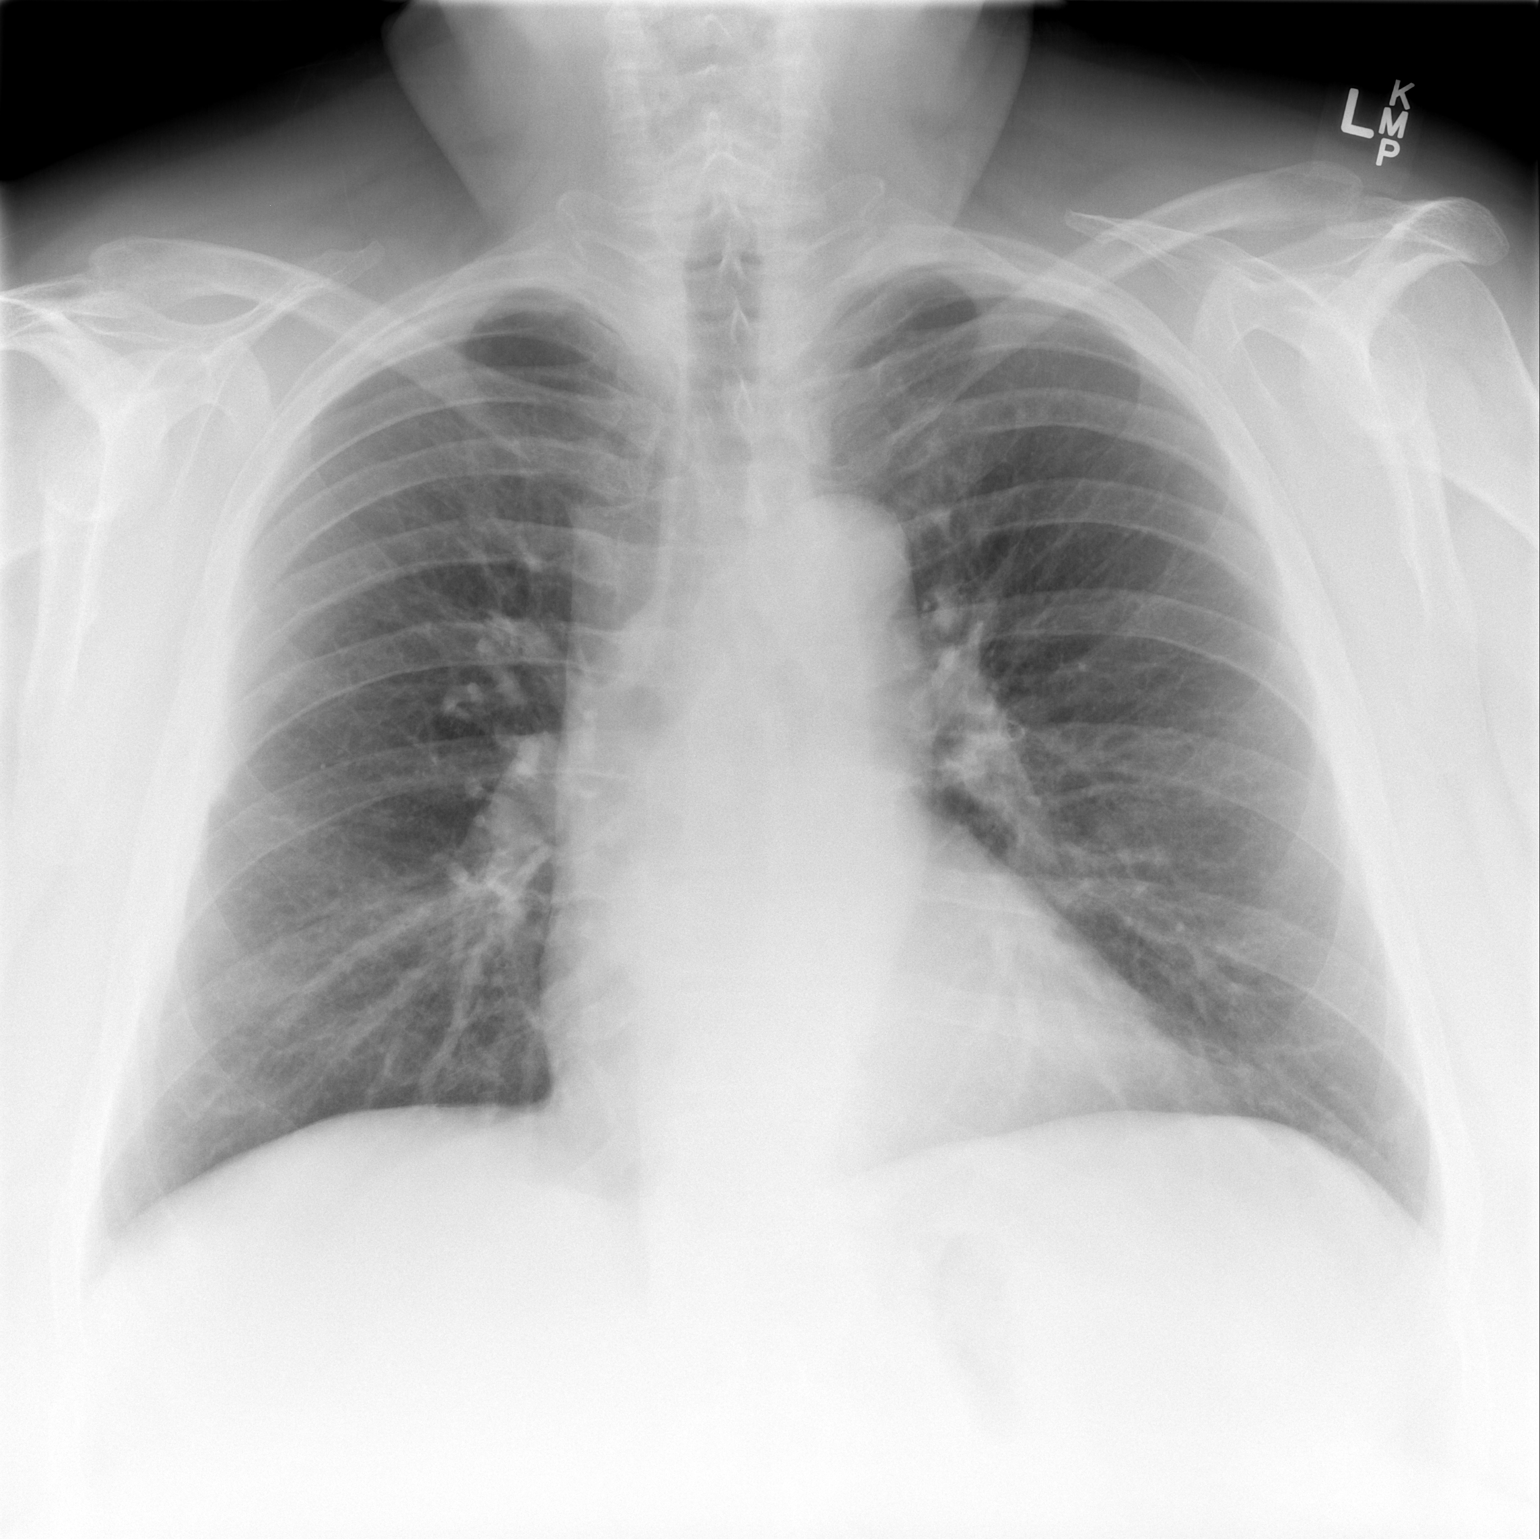

[w chest lat *]
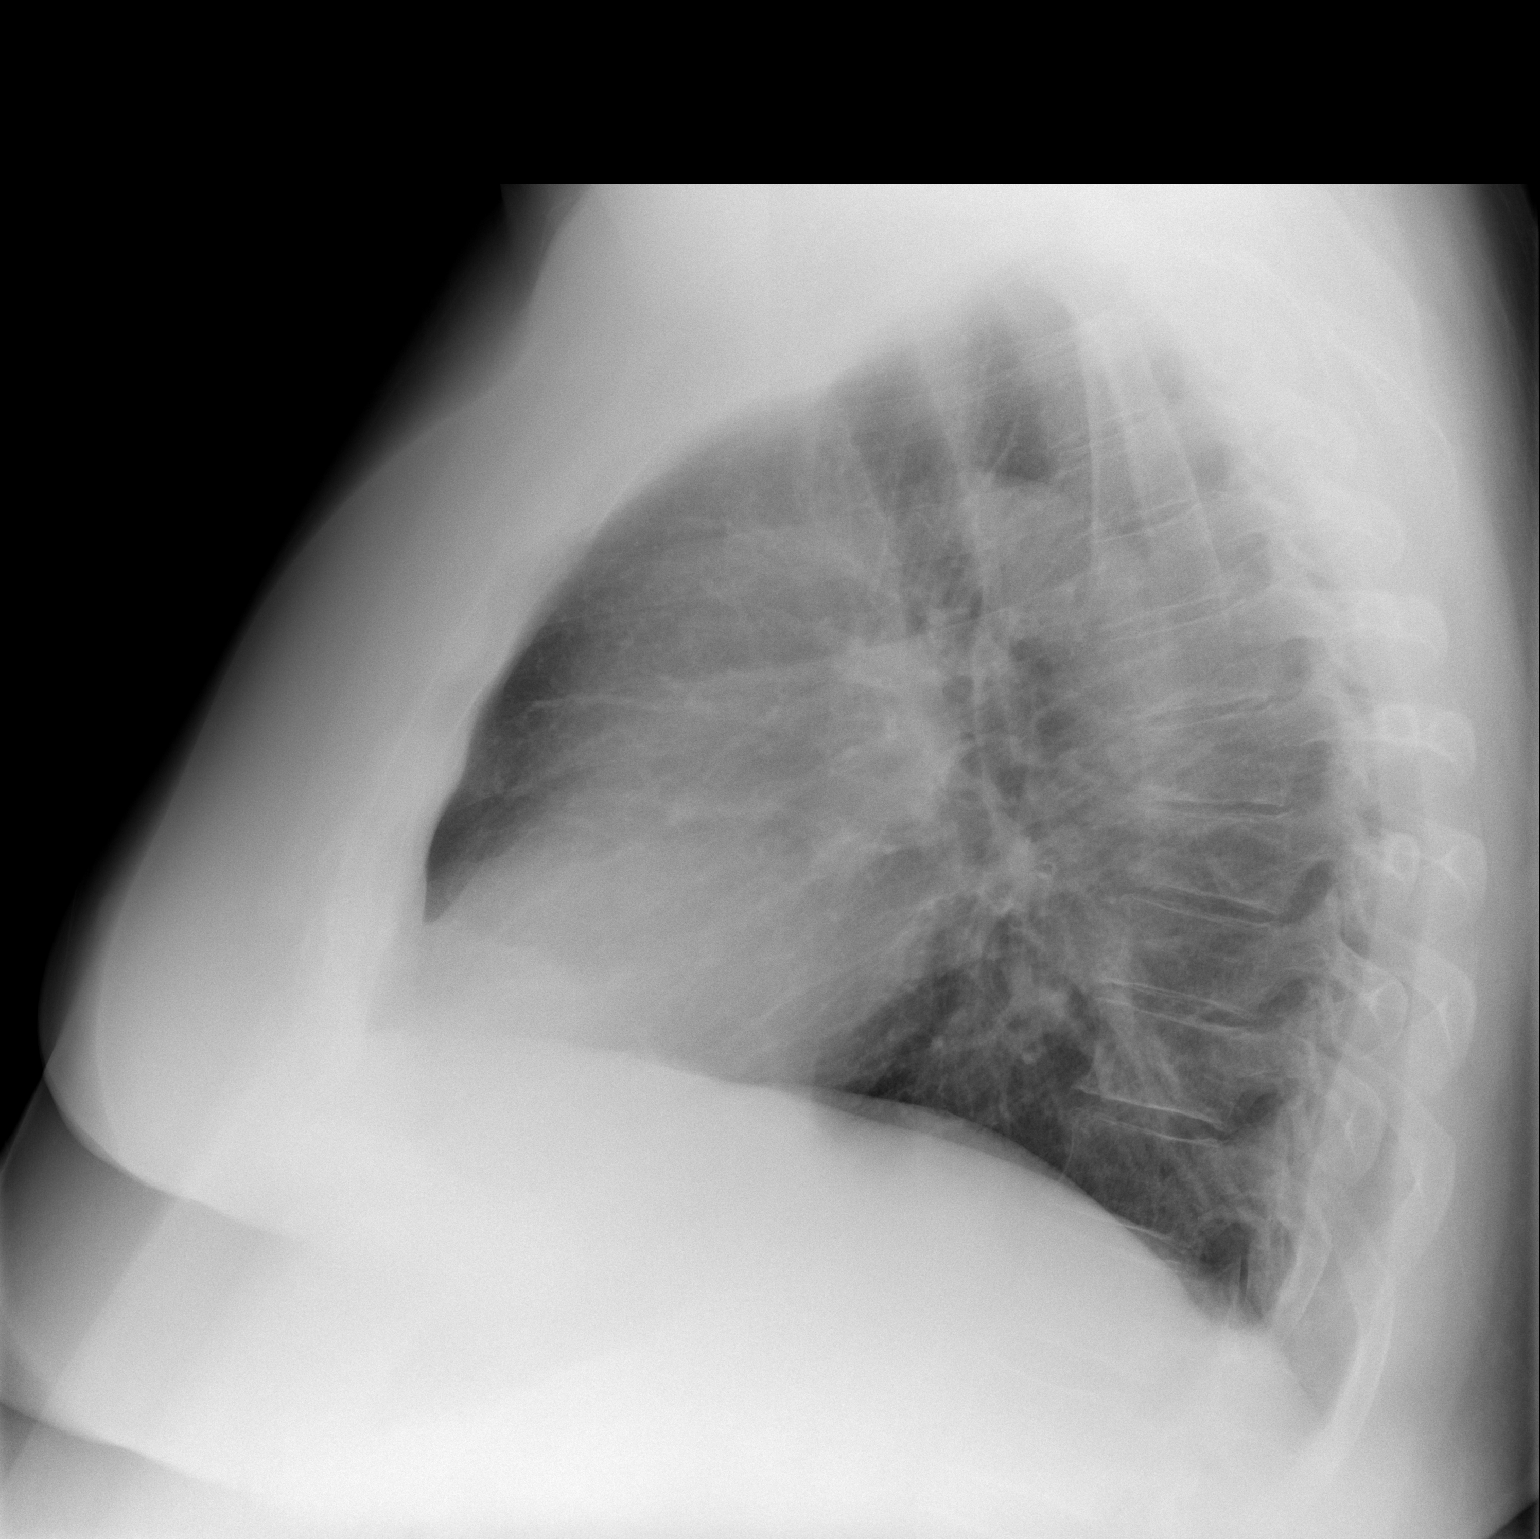

[2 of 2 positions shown; findings below may reference images not displayed]

FINDINGS: The heart size and mediastinal contours are within normal limits.
Both lungs are clear. No pneumothorax or pleural effusion is noted.
The visualized skeletal structures are unremarkable.
IMPRESSION: No active cardiopulmonary disease.

## 2019-06-19 DIAGNOSIS — K219 Gastro-esophageal reflux disease without esophagitis: Secondary | ICD-10-CM | POA: Diagnosis not present

## 2019-06-19 DIAGNOSIS — Z1211 Encounter for screening for malignant neoplasm of colon: Secondary | ICD-10-CM | POA: Diagnosis not present

## 2019-06-19 DIAGNOSIS — Z1159 Encounter for screening for other viral diseases: Secondary | ICD-10-CM | POA: Diagnosis not present

## 2019-06-19 DIAGNOSIS — N400 Enlarged prostate without lower urinary tract symptoms: Secondary | ICD-10-CM | POA: Diagnosis not present

## 2019-06-19 DIAGNOSIS — I10 Essential (primary) hypertension: Secondary | ICD-10-CM | POA: Diagnosis not present

## 2019-06-19 DIAGNOSIS — R7303 Prediabetes: Secondary | ICD-10-CM | POA: Diagnosis not present

## 2019-06-19 DIAGNOSIS — Z13220 Encounter for screening for lipoid disorders: Secondary | ICD-10-CM | POA: Diagnosis not present

## 2019-06-19 DIAGNOSIS — M25569 Pain in unspecified knee: Secondary | ICD-10-CM | POA: Diagnosis not present

## 2019-06-19 DIAGNOSIS — Z125 Encounter for screening for malignant neoplasm of prostate: Secondary | ICD-10-CM | POA: Diagnosis not present

## 2019-06-19 DIAGNOSIS — Z0000 Encounter for general adult medical examination without abnormal findings: Secondary | ICD-10-CM | POA: Diagnosis not present

## 2019-06-20 DIAGNOSIS — Z1211 Encounter for screening for malignant neoplasm of colon: Secondary | ICD-10-CM | POA: Diagnosis not present

## 2020-06-26 DIAGNOSIS — Z0000 Encounter for general adult medical examination without abnormal findings: Secondary | ICD-10-CM | POA: Diagnosis not present

## 2020-06-26 DIAGNOSIS — Z23 Encounter for immunization: Secondary | ICD-10-CM | POA: Diagnosis not present

## 2020-06-26 DIAGNOSIS — R7303 Prediabetes: Secondary | ICD-10-CM | POA: Diagnosis not present

## 2020-06-26 DIAGNOSIS — K219 Gastro-esophageal reflux disease without esophagitis: Secondary | ICD-10-CM | POA: Diagnosis not present

## 2020-06-26 DIAGNOSIS — M25569 Pain in unspecified knee: Secondary | ICD-10-CM | POA: Diagnosis not present

## 2020-06-26 DIAGNOSIS — Z125 Encounter for screening for malignant neoplasm of prostate: Secondary | ICD-10-CM | POA: Diagnosis not present

## 2020-06-26 DIAGNOSIS — Z1211 Encounter for screening for malignant neoplasm of colon: Secondary | ICD-10-CM | POA: Diagnosis not present

## 2020-06-26 DIAGNOSIS — N400 Enlarged prostate without lower urinary tract symptoms: Secondary | ICD-10-CM | POA: Diagnosis not present

## 2020-06-27 DIAGNOSIS — Z1211 Encounter for screening for malignant neoplasm of colon: Secondary | ICD-10-CM | POA: Diagnosis not present

## 2020-07-28 DIAGNOSIS — L259 Unspecified contact dermatitis, unspecified cause: Secondary | ICD-10-CM | POA: Diagnosis not present

## 2021-07-07 DIAGNOSIS — Z23 Encounter for immunization: Secondary | ICD-10-CM | POA: Diagnosis not present

## 2021-07-07 DIAGNOSIS — R7303 Prediabetes: Secondary | ICD-10-CM | POA: Diagnosis not present

## 2021-07-07 DIAGNOSIS — Z125 Encounter for screening for malignant neoplasm of prostate: Secondary | ICD-10-CM | POA: Diagnosis not present

## 2021-07-07 DIAGNOSIS — Z1211 Encounter for screening for malignant neoplasm of colon: Secondary | ICD-10-CM | POA: Diagnosis not present

## 2021-07-07 DIAGNOSIS — N400 Enlarged prostate without lower urinary tract symptoms: Secondary | ICD-10-CM | POA: Diagnosis not present

## 2021-07-07 DIAGNOSIS — M25569 Pain in unspecified knee: Secondary | ICD-10-CM | POA: Diagnosis not present

## 2021-07-07 DIAGNOSIS — Z0000 Encounter for general adult medical examination without abnormal findings: Secondary | ICD-10-CM | POA: Diagnosis not present

## 2021-07-07 DIAGNOSIS — K219 Gastro-esophageal reflux disease without esophagitis: Secondary | ICD-10-CM | POA: Diagnosis not present

## 2021-07-08 DIAGNOSIS — Z1211 Encounter for screening for malignant neoplasm of colon: Secondary | ICD-10-CM | POA: Diagnosis not present

## 2022-07-15 DIAGNOSIS — M25569 Pain in unspecified knee: Secondary | ICD-10-CM | POA: Diagnosis not present

## 2022-07-15 DIAGNOSIS — N400 Enlarged prostate without lower urinary tract symptoms: Secondary | ICD-10-CM | POA: Diagnosis not present

## 2022-07-15 DIAGNOSIS — J309 Allergic rhinitis, unspecified: Secondary | ICD-10-CM | POA: Diagnosis not present

## 2022-07-15 DIAGNOSIS — Z23 Encounter for immunization: Secondary | ICD-10-CM | POA: Diagnosis not present

## 2022-07-15 DIAGNOSIS — Z6841 Body Mass Index (BMI) 40.0 and over, adult: Secondary | ICD-10-CM | POA: Diagnosis not present

## 2022-07-15 DIAGNOSIS — K219 Gastro-esophageal reflux disease without esophagitis: Secondary | ICD-10-CM | POA: Diagnosis not present

## 2022-07-15 DIAGNOSIS — R7303 Prediabetes: Secondary | ICD-10-CM | POA: Diagnosis not present

## 2022-07-15 DIAGNOSIS — Z0000 Encounter for general adult medical examination without abnormal findings: Secondary | ICD-10-CM | POA: Diagnosis not present

## 2022-07-15 DIAGNOSIS — Z1211 Encounter for screening for malignant neoplasm of colon: Secondary | ICD-10-CM | POA: Diagnosis not present

## 2022-07-15 DIAGNOSIS — Z125 Encounter for screening for malignant neoplasm of prostate: Secondary | ICD-10-CM | POA: Diagnosis not present

## 2022-07-16 DIAGNOSIS — Z1211 Encounter for screening for malignant neoplasm of colon: Secondary | ICD-10-CM | POA: Diagnosis not present

## 2023-08-09 DIAGNOSIS — R350 Frequency of micturition: Secondary | ICD-10-CM | POA: Diagnosis not present

## 2023-08-17 DIAGNOSIS — Z1211 Encounter for screening for malignant neoplasm of colon: Secondary | ICD-10-CM | POA: Diagnosis not present
